# Patient Record
Sex: Male | Born: 1995 | Race: Black or African American | Hispanic: No | Marital: Single | Smoking: Never smoker
Health system: Southern US, Community
[De-identification: ages and names within clinical notes are randomized; demographics above are authoritative.]

## PROBLEM LIST (undated history)

## (undated) DIAGNOSIS — F840 Autistic disorder: Secondary | ICD-10-CM

## (undated) DIAGNOSIS — F819 Developmental disorder of scholastic skills, unspecified: Secondary | ICD-10-CM

## (undated) HISTORY — PX: NO PAST SURGERIES: SHX2092

## (undated) HISTORY — DX: Developmental disorder of scholastic skills, unspecified: F81.9

---
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
## 2022-03-20 ENCOUNTER — Ambulatory Visit: Payer: Self-pay | Admitting: Family Medicine

## 2022-04-23 ENCOUNTER — Other Ambulatory Visit: Payer: Self-pay | Admitting: Urology

## 2022-05-18 ENCOUNTER — Other Ambulatory Visit: Payer: Self-pay

## 2022-05-18 ENCOUNTER — Encounter (HOSPITAL_BASED_OUTPATIENT_CLINIC_OR_DEPARTMENT_OTHER): Payer: Self-pay | Admitting: Urology

## 2022-05-18 NOTE — Progress Notes (Signed)
Spoke w/ via phone for pre-op interview--- pt's paternal Elenor Legato, Doristine Counter Lab needs dos----   no           Lab results------ no COVID test -----patient states asymptomatic no test needed Arrive at ------- 1015 on 05-22-2022 NPO after MN NO Solid Food.  Clear liquids (water / gaterade) from MN until--- 0900 Med rec completed Medications to take morning of surgery ----- none Diabetic medication ----- n/a Patient instructed no nail polish to be worn day of surgery Patient instructed to bring photo id and insurance card day of surgery Patient aware to have Driver (ride ) / caregiver for 24 hours after surgery -- uncle , Jermone Patient Special Instructions ----- n/a  Pre-Op special Istructions ----- pt has mental development delay/ autism spectrum disorder.  Pt is verbal, can express his needs, and on grade level 3rd-5th.  Pt's parents are decreased, he resides w/ paternal aunt and uncle . Since pt resides with them they do not need legal guardianship documentation.    Patient verbalized understanding of instructions that were given at this phone interview. Patient denies shortness of breath, chest pain, fever, cough at this phone interview.

## 2022-05-21 NOTE — H&P (Signed)
Patient is a 26 year old African-American male who is autistic. Patient comes with his aunt who is his medical power of attorney. He desires bilateral partial vasectomy for elective sterilization.     ALLERGIES: None   MEDICATIONS: None   GU PSH: None   NON-GU PSH: None   GU PMH: None   NON-GU PMH: Encounter for autism screening    FAMILY HISTORY: Death In The Family Father - Father Death In The Family Mother - Mother seizure disorder - Father   SOCIAL HISTORY: Marital Status: Single Preferred Language: English; Ethnicity: Not Hispanic Or Latino; Race: Black or African American Current Smoking Status: Patient has never smoked.   Tobacco Use Assessment Completed: Used Tobacco in last 30 days? Has never drank.  Does not drink caffeine. 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    REVIEW OF SYSTEMS:    GU Review Male:   Patient reports get up at night to urinate. Patient denies hard to postpone urination, stream starts and stops, have to strain to urinate , penile pain, leakage of urine, trouble starting your stream, burning/ pain with urination, frequent urination, and erection problems.  Gastrointestinal (Upper):   Patient denies nausea, vomiting, and indigestion/ heartburn.  Gastrointestinal (Lower):   Patient denies diarrhea and constipation.  Constitutional:   Patient denies fever, night sweats, weight loss, and fatigue.  Skin:   Patient denies skin rash/ lesion and itching.  Eyes:   Patient denies blurred vision and double vision.  Ears/ Nose/ Throat:   Patient denies sore throat and sinus problems.  Hematologic/Lymphatic:   Patient denies swollen glands and easy bruising.  Cardiovascular:   Patient denies leg swelling and chest pains.  Respiratory:   Patient denies cough and shortness of breath.  Endocrine:   Patient denies excessive thirst.  Musculoskeletal:   Patient denies back pain and joint pain.  Neurological:   Patient denies headaches and dizziness.   Psychologic:   Patient denies depression and anxiety.   VITAL SIGNS:      04/11/2022 09:26 AM  Weight 180.2 lb / 81.74 kg  Height 74 in / 187.96 cm  BP 125/76 mmHg  Pulse 73 /min  Temperature 98.4 F / 36.8 C  BMI 23.1 kg/m   GU PHYSICAL EXAMINATION:    Anus and Perineum: No hemorrhoids. No anal stenosis. No rectal fissure, no anal fissure. No edema, no dimple, no perineal tenderness, no anal tenderness.  Scrotum: No lesions. No edema. No cysts. No warts.  Epididymides: Right: no spermatocele, no masses, no cysts, no tenderness, no induration, no enlargement. Left: no spermatocele, no masses, no cysts, no tenderness, no induration, no enlargement.  Testes: No tenderness, no swelling, no enlargement left testis. No tenderness, no swelling, no enlargement right testis. Normal location left testis. Normal location right testis. No mass, no cyst, no varicocele, no hydrocele left testis. No mass, no cyst, no varicocele, no hydrocele right testis. Both vas deferens are easily palpable and movable to the midline  Urethral Meatus: Normal size. No lesion, no wart, no discharge, no polyp. Normal location.  Penis: Circumcised, no warts, no cracks. No dorsal Peyronie's plaques, no left corporal Peyronie's plaques, no right corporal Peyronie's plaques, no scarring, no warts. No balanitis, no meatal stenosis.   MULTI-SYSTEM PHYSICAL EXAMINATION:    Constitutional: Well-nourished. No physical deformities. Normally developed. Good grooming.  Neck: Neck symmetrical, not swollen. Normal tracheal position.  Respiratory: No labored breathing, no use of accessory muscles.   Cardiovascular: Normal temperature, normal extremity pulses, no swelling, no varicosities.  Lymphatic: No enlargement of neck, axillae, groin.  Skin: No paleness, no jaundice, no cyanosis. No lesion, no ulcer, no rash.  Neurologic / Psychiatric: Oriented to time, oriented to place, oriented to person. No depression, no anxiety, no  agitation.  Eyes: Normal conjunctivae. Normal eyelids.  Ears, Nose, Mouth, and Throat: Left ear no scars, no lesions, no masses. Right ear no scars, no lesions, no masses. Nose no scars, no lesions, no masses. Normal hearing. Normal lips.  Musculoskeletal: Normal gait and station of head and neck.     PAST DATA REVIEW: None   PROCEDURES:          Urinalysis w/Scope - 81001 Dipstick Dipstick Cont'd Micro  Color: Yellow Bilirubin: Neg mg/dL WBC/hpf: 0 - 5/hpf  Appearance: Clear Ketones: Neg mg/dL RBC/hpf: 0 - 2/hpf  Specific Gravity: 1.025 Blood: Neg ery/uL Bacteria: Rare (0-9/hpf)  pH: 6.0 Protein: 1+ mg/dL Cystals: NS (Not Seen)  Glucose: Neg mg/dL Urobilinogen: 0.2 mg/dL Casts: NS (Not Seen)    Nitrites: Neg Trichomonas: Not Present    Leukocyte Esterase: Neg leu/uL Mucous: Present      Epithelial Cells: 0 - 5/hpf      Yeast: NS (Not Seen)      Sperm: Not Present    ASSESSMENT:      ICD-10 Details  1 NON-GU:   Encounter for vasectomy consultation - Z30.09 Acute, Complicated Injury   PLAN:           Document Letter(s):  Created for Patient: Clinical Summary         Notes:   Vasectomy consent: I have discussed with the patient the risks and benefits of the procedure of vasectomy which include but are not limited to: Bleeding, infection, swelling and bruising of the scrotum, sperm granuloma formation, chronic scrotal pain, testicular atrophy, loss of the testicle, hydrocele formation and or recanalization of the vas deferens which could possibly lead to pregnancy. The patient also understands the need for at least 1 postoperative semen analysis to ensure that there are no sperm present before they rely on this method of contraception. The patient understands that for 2-3 months after vasectomy he still able to possibly conceive. They understand they will need to use other forms of contraception until semen analysis is negative in our office and the patient is notified of the results.  The patient voices understanding of the risks and benefits of the procedure and desires to proceed and consents  Due to autism recommended procedure be done under general anesthesia. We will schedule accordingly in the near future.

## 2022-05-22 ENCOUNTER — Encounter (HOSPITAL_BASED_OUTPATIENT_CLINIC_OR_DEPARTMENT_OTHER)
Admission: RE | Disposition: A | Discharge status: Home / Self Care | Payer: Self-pay | Source: Home / Self Care | Attending: Urology

## 2022-05-22 ENCOUNTER — Ambulatory Visit (HOSPITAL_BASED_OUTPATIENT_CLINIC_OR_DEPARTMENT_OTHER): Payer: Medicare HMO | Admitting: Anesthesiology

## 2022-05-22 ENCOUNTER — Ambulatory Visit (HOSPITAL_BASED_OUTPATIENT_CLINIC_OR_DEPARTMENT_OTHER)
Admission: RE | Admit: 2022-05-22 | Discharge: 2022-05-22 | Disposition: A | Discharge status: Home / Self Care | Payer: Medicare HMO | Attending: Urology | Admitting: Urology

## 2022-05-22 ENCOUNTER — Other Ambulatory Visit: Payer: Self-pay

## 2022-05-22 ENCOUNTER — Encounter (HOSPITAL_BASED_OUTPATIENT_CLINIC_OR_DEPARTMENT_OTHER): Payer: Self-pay | Admitting: Urology

## 2022-05-22 DIAGNOSIS — F840 Autistic disorder: Secondary | ICD-10-CM | POA: Diagnosis not present

## 2022-05-22 DIAGNOSIS — Z302 Encounter for sterilization: Secondary | ICD-10-CM | POA: Diagnosis present

## 2022-05-22 DIAGNOSIS — Z01818 Encounter for other preprocedural examination: Secondary | ICD-10-CM

## 2022-05-22 HISTORY — DX: Autistic disorder: F84.0

## 2022-05-22 HISTORY — PX: VASECTOMY: SHX75

## 2022-05-22 SURGERY — VASECTOMY
Anesthesia: General | Site: Scrotum

## 2022-05-22 MED ORDER — FENTANYL CITRATE (PF) 100 MCG/2ML IJ SOLN
INTRAMUSCULAR | Status: DC | PRN
  Administered 2022-05-22 (×3): 50 ug via INTRAVENOUS

## 2022-05-22 MED ORDER — CEFAZOLIN SODIUM-DEXTROSE 2-4 GM/100ML-% IV SOLN
INTRAVENOUS | Status: AC
  Filled 2022-05-22: qty 100

## 2022-05-22 MED ORDER — PROPOFOL 10 MG/ML IV BOLUS
INTRAVENOUS | Status: DC | PRN
  Administered 2022-05-22: 100 mg via INTRAVENOUS
  Administered 2022-05-22: 200 mg via INTRAVENOUS

## 2022-05-22 MED ORDER — AMISULPRIDE (ANTIEMETIC) 5 MG/2ML IV SOLN
INTRAVENOUS | Status: AC
  Filled 2022-05-22: qty 4

## 2022-05-22 MED ORDER — LACTATED RINGERS IV SOLN: INTRAVENOUS | Status: DC

## 2022-05-22 MED ORDER — LIDOCAINE HCL (PF) 2 % IJ SOLN
INTRAMUSCULAR | Status: AC
  Filled 2022-05-22: qty 5

## 2022-05-22 MED ORDER — OXYCODONE HCL 5 MG PO TABS: 5.0000 mg | ORAL_TABLET | Freq: Once | ORAL | Status: DC | PRN

## 2022-05-22 MED ORDER — KETOROLAC TROMETHAMINE 30 MG/ML IJ SOLN
INTRAMUSCULAR | Status: DC | PRN
  Administered 2022-05-22: 30 mg via INTRAVENOUS

## 2022-05-22 MED ORDER — ACETAMINOPHEN 500 MG PO TABS
1000.0000 mg | ORAL_TABLET | Freq: Once | ORAL | Status: AC
  Administered 2022-05-22: 1000 mg via ORAL

## 2022-05-22 MED ORDER — PROPOFOL 10 MG/ML IV BOLUS
INTRAVENOUS | Status: AC
  Filled 2022-05-22: qty 20

## 2022-05-22 MED ORDER — DEXAMETHASONE SODIUM PHOSPHATE 4 MG/ML IJ SOLN
INTRAMUSCULAR | Status: DC | PRN
  Administered 2022-05-22: 10 mg via INTRAVENOUS

## 2022-05-22 MED ORDER — KETOROLAC TROMETHAMINE 30 MG/ML IJ SOLN
INTRAMUSCULAR | Status: AC
  Filled 2022-05-22: qty 1

## 2022-05-22 MED ORDER — FENTANYL CITRATE (PF) 100 MCG/2ML IJ SOLN
INTRAMUSCULAR | Status: AC
  Filled 2022-05-22: qty 2

## 2022-05-22 MED ORDER — AMISULPRIDE (ANTIEMETIC) 5 MG/2ML IV SOLN
10.0000 mg | Freq: Once | INTRAVENOUS | Status: AC | PRN
  Administered 2022-05-22: 10 mg via INTRAVENOUS

## 2022-05-22 MED ORDER — FENTANYL CITRATE (PF) 100 MCG/2ML IJ SOLN: 25.0000 ug | INTRAMUSCULAR | Status: DC | PRN

## 2022-05-22 MED ORDER — MIDAZOLAM HCL 5 MG/5ML IJ SOLN
INTRAMUSCULAR | Status: DC | PRN
  Administered 2022-05-22: 2 mg via INTRAVENOUS

## 2022-05-22 MED ORDER — MIDAZOLAM HCL 2 MG/2ML IJ SOLN
INTRAMUSCULAR | Status: AC
  Filled 2022-05-22: qty 2

## 2022-05-22 MED ORDER — TRAMADOL HCL 50 MG PO TABS: 50.0000 mg | ORAL_TABLET | Freq: Four times a day (QID) | ORAL | 0 refills | Status: AC | PRN

## 2022-05-22 MED ORDER — ONDANSETRON HCL 4 MG/2ML IJ SOLN
INTRAMUSCULAR | Status: AC
  Filled 2022-05-22: qty 2

## 2022-05-22 MED ORDER — ONDANSETRON HCL 4 MG/2ML IJ SOLN
INTRAMUSCULAR | Status: DC | PRN
  Administered 2022-05-22: 4 mg via INTRAVENOUS

## 2022-05-22 MED ORDER — 0.9 % SODIUM CHLORIDE (POUR BTL) OPTIME
TOPICAL | Status: DC | PRN
  Administered 2022-05-22: 500 mL

## 2022-05-22 MED ORDER — OXYCODONE HCL 5 MG/5ML PO SOLN: 5.0000 mg | Freq: Once | ORAL | Status: DC | PRN

## 2022-05-22 MED ORDER — BUPIVACAINE HCL 0.5 % IJ SOLN
INTRAMUSCULAR | Status: DC | PRN
  Administered 2022-05-22: 4 mL

## 2022-05-22 MED ORDER — LIDOCAINE HCL (CARDIAC) PF 100 MG/5ML IV SOSY
PREFILLED_SYRINGE | INTRAVENOUS | Status: DC | PRN
  Administered 2022-05-22: 100 mg via INTRAVENOUS

## 2022-05-22 MED ORDER — CEFAZOLIN SODIUM-DEXTROSE 2-4 GM/100ML-% IV SOLN
2.0000 g | INTRAVENOUS | Status: AC
  Administered 2022-05-22: 2 g via INTRAVENOUS

## 2022-05-22 MED ORDER — DEXAMETHASONE SODIUM PHOSPHATE 10 MG/ML IJ SOLN
INTRAMUSCULAR | Status: AC
  Filled 2022-05-22: qty 1

## 2022-05-22 MED ORDER — ACETAMINOPHEN 500 MG PO TABS
ORAL_TABLET | ORAL | Status: AC
  Filled 2022-05-22: qty 2

## 2022-05-22 MED ORDER — ONDANSETRON HCL 4 MG/2ML IJ SOLN: 4.0000 mg | Freq: Once | INTRAMUSCULAR | Status: DC | PRN

## 2022-05-22 SURGICAL SUPPLY — 33 items
BLADE CLIPPER SENSICLIP SURGIC (BLADE) IMPLANT
BLADE SURG 15 STRL LF DISP TIS (BLADE) ×1 IMPLANT
BLADE SURG 15 STRL SS (BLADE) ×2
BNDG GAUZE ELAST 4 BULKY (GAUZE/BANDAGES/DRESSINGS) IMPLANT
CLOTH BEACON ORANGE TIMEOUT ST (SAFETY) ×2 IMPLANT
COVER BACK TABLE 60X90IN (DRAPES) ×2 IMPLANT
COVER MAYO STAND STRL (DRAPES) ×2 IMPLANT
DRAPE LAPAROTOMY 100X72 PEDS (DRAPES) ×2 IMPLANT
ELECT NDL TIP 2.8 STRL (NEEDLE) IMPLANT
ELECT NEEDLE TIP 2.8 STRL (NEEDLE) IMPLANT
ELECT REM PT RETURN 9FT ADLT (ELECTROSURGICAL) ×2
ELECTRODE REM PT RTRN 9FT ADLT (ELECTROSURGICAL) ×1 IMPLANT
GAUZE 4X4 16PLY ~~LOC~~+RFID DBL (SPONGE) ×2 IMPLANT
GLOVE BIO SURGEON STRL SZ7.5 (GLOVE) ×2 IMPLANT
GLOVE BIOGEL PI IND STRL 7.5 (GLOVE) IMPLANT
GLOVE BIOGEL PI INDICATOR 7.5 (GLOVE) ×1
GLOVE ECLIPSE 7.5 STRL STRAW (GLOVE) ×1 IMPLANT
GOWN STRL REUS W/TWL LRG LVL3 (GOWN DISPOSABLE) ×1 IMPLANT
KIT TURNOVER CYSTO (KITS) ×2 IMPLANT
MANIFOLD NEPTUNE II (INSTRUMENTS) IMPLANT
NDL HYPO 25X1 1.5 SAFETY (NEEDLE) ×1 IMPLANT
NEEDLE HYPO 25X1 1.5 SAFETY (NEEDLE) ×2 IMPLANT
NS IRRIG 500ML POUR BTL (IV SOLUTION) IMPLANT
PACK BASIN DAY SURGERY FS (CUSTOM PROCEDURE TRAY) ×2 IMPLANT
PENCIL SMOKE EVACUATOR (MISCELLANEOUS) ×2 IMPLANT
SUPPORT SCROTAL LG STRP (MISCELLANEOUS) ×2 IMPLANT
SUT GUT CHROMIC 3 0 (SUTURE) ×2 IMPLANT
SUT PDS AB 3-0 SH 27 (SUTURE) ×4 IMPLANT
SYR CONTROL 10ML LL (SYRINGE) ×2 IMPLANT
TOWEL OR 17X26 10 PK STRL BLUE (TOWEL DISPOSABLE) ×2 IMPLANT
TRAY DSU PREP LF (CUSTOM PROCEDURE TRAY) ×2 IMPLANT
TUBE CONNECTING 12X1/4 (SUCTIONS) IMPLANT
WATER STERILE IRR 500ML POUR (IV SOLUTION) IMPLANT

## 2022-05-22 NOTE — Anesthesia Preprocedure Evaluation (Signed)
Anesthesia Evaluation  Patient identified by MRN, date of birth, ID band Patient awake    Reviewed: Allergy & Precautions, NPO status , Patient's Chart, lab work & pertinent test results  History of Anesthesia Complications Negative for: history of anesthetic complications  Airway Mallampati: II  TM Distance: >3 FB Neck ROM: Full    Dental  (+) Teeth Intact, Dental Advisory Given   Pulmonary neg pulmonary ROS,    Pulmonary exam normal        Cardiovascular negative cardio ROS Normal cardiovascular exam     Neuro/Psych Autism    GI/Hepatic negative GI ROS, Neg liver ROS,   Endo/Other  negative endocrine ROS  Renal/GU negative Renal ROS  negative genitourinary   Musculoskeletal negative musculoskeletal ROS (+)   Abdominal   Peds  Hematology negative hematology ROS (+)   Anesthesia Other Findings   Reproductive/Obstetrics                             Anesthesia Physical Anesthesia Plan  ASA: 2  Anesthesia Plan: General   Post-op Pain Management: Tylenol PO (pre-op)* and Toradol IV (intra-op)*   Induction: Intravenous  PONV Risk Score and Plan: 2 and Ondansetron, Dexamethasone, Midazolam and Treatment may vary due to age or medical condition  Airway Management Planned: LMA  Additional Equipment: None  Intra-op Plan:   Post-operative Plan: Extubation in OR  Informed Consent: I have reviewed the patients History and Physical, chart, labs and discussed the procedure including the risks, benefits and alternatives for the proposed anesthesia with the patient or authorized representative who has indicated his/her understanding and acceptance.     Dental advisory given  Plan Discussed with:   Anesthesia Plan Comments:         Anesthesia Quick Evaluation

## 2022-05-22 NOTE — Op Note (Signed)
Preoperative diagnosis:  1.  Undesired fertility  Postoperative diagnosis: 1.  Undesired fertility  Procedure(s): 1.  Bilateral partial vasectomy  Surgeon: Dr. Karoline Caldwell  Anesthesia: General  Complications: None  EBL: Minimal  Specimens: None  Disposition of specimens: Not applicable  Intraoperative findings: Bilateral partial vasectomy performed through single midline scrotal incision  Indication: Patient is a 26 year old autistic male desires bilateral partial vasectomy for elective sterilization.  Description of procedure:fter obtaining informed consent for the patient he was taken the major OR suite placed under general anesthesia.  Placed in the supine position genitalia prepped and draped in usual sterile fashion.  Proper pause and timeout was performed.  The left vas deferens was tented up to the midline of the scrotum.  Skin and vas deferens infiltrated with 1% plain lidocaine.  Puncture incision was made utilizing the dissecting forceps.  Ring forcep was utilized to grasp the vas deferens and this was subsequently isolated out through the scrotal incision.  Vas deferens was dissected free and curved hemostat that clamps were placed across the vas deferens.  Centimeter section of the vas deferens was removed.  Both ends of the vas deferens individually cauterized.  Both ends of the vas deferens were then tied with 3-0 PDS suture proximal end of vas deferens was subsequently oversewn utilizing 3-0 PDS suture to keep the cut ends in separate tissue plane.  Good hemostasis was noted.  The vas deferens were then returned to their [place] position and the left hemiscrotum.  In similar manner right vas deferens was performed through the same midline scrotal incision.  Skin edges then reapproximated with interrupted 3-0 chromic suture.  Antibiotic ointment placed over the incision site and scrotal support placed.  Procedure was terminated he was awakened from anesthesia and taken back  to recovery room in stable condition.  No immediate complication from the procedure

## 2022-05-22 NOTE — Interval H&P Note (Signed)
History and Physical Interval Note:  05/22/2022 10:26 AM  Joseph Zimmerman  has presented today for surgery, with the diagnosis of UNDESIRED FERTILITY.  The various methods of treatment have been discussed with the patient and family. After consideration of risks, benefits and other options for treatment, the patient has consented to  Procedure(s): VASECTOMY (N/A) as a surgical intervention.  The patient's history has been reviewed, patient examined, no change in status, stable for surgery.  I have reviewed the patient's chart and labs.  Questions were answered to the patient's satisfaction.     [name]

## 2022-05-22 NOTE — Interval H&P Note (Signed)
History and Physical Interval Note:  05/22/2022 10:22 AM  Joseph Zimmerman  has presented today for surgery, with the diagnosis of UNDESIRED FERTILITY.  The various methods of treatment have been discussed with the patient and family. After consideration of risks, benefits and other options for treatment, the patient has consented to  Procedure(s): VASECTOMY (N/A) as a surgical intervention.  The patient's history has been reviewed, patient examined, no change in status, stable for surgery.  I have reviewed the patient's chart and labs.  Questions were answered to the patient's satisfaction.     [name]

## 2022-05-22 NOTE — Anesthesia Postprocedure Evaluation (Signed)
Anesthesia Post Note  Patient: Joseph Zimmerman  Procedure(s) Performed: VASECTOMY (Scrotum)     Patient location during evaluation: PACU Anesthesia Type: General Level of consciousness: awake and alert Pain management: pain level controlled Vital Signs Assessment: post-procedure vital signs reviewed and stable Respiratory status: spontaneous breathing, nonlabored ventilation and respiratory function stable Cardiovascular status: blood pressure returned to baseline and stable Postop Assessment: no apparent nausea or vomiting Anesthetic complications: no   No notable events documented.  Last Vitals:  Vitals:   05/22/22 1400 05/22/22 1415  BP: 129/79 126/87  Pulse:  73  Resp: 16 13  Temp: (!) 36.4 C   SpO2: 100% 98%    Last Pain:  Vitals:   05/22/22 1415  TempSrc:   PainSc: 0-No pain                 Lidia Collum

## 2022-05-22 NOTE — Transfer of Care (Signed)
Immediate Anesthesia Transfer of Care Note  Patient: Joseph Zimmerman  Procedure(s) Performed: Procedure(s) (LRB): VASECTOMY (N/A)  Patient Location: PACU  Anesthesia Type: General  Level of Consciousness: awake, sedated, patient cooperative and responds to stimulation  Airway & Oxygen Therapy: Patient Spontanous Breathing and Patient connected to [place] [address]   Post-op Assessment: Report given to PACU RN, Post -op Vital signs reviewed and stable and Patient moving all extremities  Post vital signs: Reviewed and stable  Complications: No apparent anesthesia complications

## 2022-05-22 NOTE — Discharge Instructions (Addendum)
No acetaminophen/Tylenol until after 4:18pm today if needed for pain.   No ibuprofen, Advil, Aleve, Motrin, ketorolac, meloxicam, naproxen, or other NSAIDS until after 7:02pm today if needed for pain.      Post Anesthesia Home Care Instructions  Activity: Get plenty of rest for the remainder of the day. A responsible individual must stay with you for 24 hours following the procedure.  For the next 24 hours, DO NOT: -Drive a car -Advertising copywriter -Drink alcoholic beverages -Take any medication unless instructed by your physician -Make any legal decisions or sign important papers.  Meals: Start with liquid foods such as gelatin or soup. Progress to regular foods as tolerated. Avoid greasy, spicy, heavy foods. If nausea and/or vomiting occur, drink only clear liquids until the nausea and/or vomiting subsides. Call your physician if vomiting continues.  Special Instructions/Symptoms: Your throat may feel dry or sore from the anesthesia or the breathing tube placed in your throat during surgery. If this causes discomfort, gargle with warm salt water. The discomfort should disappear within 24 hours.

## 2022-05-22 NOTE — Anesthesia Procedure Notes (Signed)
Procedure Name: LMA Insertion Date/Time: 05/22/2022 12:28 PM Performed by: Jessica Priest, CRNA Pre-anesthesia Checklist: Patient identified, Emergency Drugs available, Suction available, Patient being monitored and Timeout performed Patient Re-evaluated:Patient Re-evaluated prior to induction Oxygen Delivery Method: Circle system utilized Preoxygenation: Pre-oxygenation with 100% oxygen Induction Type: IV induction Ventilation: Mask ventilation without difficulty LMA: LMA inserted LMA Size: 4.0 Number of attempts: 1 Airway Equipment and Method: Bite block Placement Confirmation: positive ETCO2, breath sounds checked- equal and bilateral and CO2 detector Tube secured with: Tape Dental Injury: Teeth and Oropharynx as per pre-operative assessment

## 2022-05-23 ENCOUNTER — Encounter (HOSPITAL_BASED_OUTPATIENT_CLINIC_OR_DEPARTMENT_OTHER): Payer: Self-pay | Admitting: Urology
